# Patient Record
Sex: Male | Born: 1975 | Race: Black or African American | Hispanic: No | Marital: Single | Smoking: Current every day smoker
Health system: Southern US, Community
[De-identification: ages and names within clinical notes are randomized; demographics above are authoritative.]

## PROBLEM LIST (undated history)

## (undated) DIAGNOSIS — I10 Essential (primary) hypertension: Secondary | ICD-10-CM

## (undated) DIAGNOSIS — R011 Cardiac murmur, unspecified: Secondary | ICD-10-CM

---

## 2005-02-06 ENCOUNTER — Emergency Department (HOSPITAL_COMMUNITY): Admission: EM | Admit: 2005-02-06 | Discharge: 2005-02-07 | Payer: Self-pay | Admitting: Emergency Medicine

## 2008-02-10 ENCOUNTER — Emergency Department (HOSPITAL_COMMUNITY): Admission: EM | Admit: 2008-02-10 | Discharge: 2008-02-10 | Payer: Self-pay | Admitting: Emergency Medicine

## 2008-02-18 ENCOUNTER — Emergency Department (HOSPITAL_COMMUNITY): Admission: EM | Admit: 2008-02-18 | Discharge: 2008-02-18 | Payer: Self-pay | Admitting: Emergency Medicine

## 2015-11-12 ENCOUNTER — Encounter (HOSPITAL_COMMUNITY): Payer: Self-pay | Admitting: Emergency Medicine

## 2015-11-12 ENCOUNTER — Emergency Department (HOSPITAL_COMMUNITY)
Admission: EM | Admit: 2015-11-12 | Discharge: 2015-11-12 | Disposition: A | Discharge status: Home / Self Care | Payer: Self-pay | Attending: Emergency Medicine | Admitting: Emergency Medicine

## 2015-11-12 ENCOUNTER — Emergency Department (HOSPITAL_COMMUNITY): Payer: Self-pay

## 2015-11-12 DIAGNOSIS — W010XXA Fall on same level from slipping, tripping and stumbling without subsequent striking against object, initial encounter: Secondary | ICD-10-CM | POA: Insufficient documentation

## 2015-11-12 DIAGNOSIS — S82452A Displaced comminuted fracture of shaft of left fibula, initial encounter for closed fracture: Secondary | ICD-10-CM | POA: Insufficient documentation

## 2015-11-12 DIAGNOSIS — S82842A Displaced bimalleolar fracture of left lower leg, initial encounter for closed fracture: Secondary | ICD-10-CM

## 2015-11-12 DIAGNOSIS — Y9289 Other specified places as the place of occurrence of the external cause: Secondary | ICD-10-CM | POA: Insufficient documentation

## 2015-11-12 DIAGNOSIS — F17200 Nicotine dependence, unspecified, uncomplicated: Secondary | ICD-10-CM | POA: Insufficient documentation

## 2015-11-12 DIAGNOSIS — Y998 Other external cause status: Secondary | ICD-10-CM | POA: Insufficient documentation

## 2015-11-12 DIAGNOSIS — Y9389 Activity, other specified: Secondary | ICD-10-CM | POA: Insufficient documentation

## 2015-11-12 MED ORDER — OXYCODONE-ACETAMINOPHEN 5-325 MG PO TABS
1.0000 | ORAL_TABLET | Freq: Once | ORAL | Status: AC
  Administered 2015-11-12: 1 via ORAL
  Filled 2015-11-12: qty 1

## 2015-11-12 MED ORDER — OXYCODONE-ACETAMINOPHEN 5-325 MG PO TABS: 2.0000 | ORAL_TABLET | ORAL | Status: DC | PRN

## 2015-11-12 NOTE — ED Provider Notes (Signed)
CSN: [id number]     Arrival date & time 11/12/15  2131 History  By signing my name below, I, Lyndel Safe, attest that this documentation has been prepared under the direction and in the presence of [employer], PA-C. Electronically Signed: Lyndel Safe, ED Scribe. 11/12/2015. 9:46 PM.   Chief Complaint  Patient presents with  . Ankle Injury   The history is provided by the patient. No language interpreter was used.   HPI Comments: Scott Horne is a 40 y.o. male who presents to the Emergency Department complaining of sudden onset, constant, moderate pain to left ankle with swelling that is greatest to the medial aspect of left ankle s/p mechanical fall that occurred PTA. Pt reports he slipped on ice and endured an eversion injury to left ankle. He notes he was able to walk approximately 0.5 of a mile after the injury. His pian is worse with ROM of left ankle. Denies head injury, LOC, numbness, tingling, or any other deformities s/p fall.   History reviewed. No pertinent past medical history. History reviewed. No pertinent past surgical history. No family history on file. Social History  Substance Use Topics  . Smoking status: Current Every Day Smoker  . Smokeless tobacco: None  . Alcohol Use: No    Review of Systems  Musculoskeletal: Positive for joint swelling ( left ankle) and arthralgias ( left ankle ). Negative for gait problem.  Skin: Negative for wound.  Neurological: Negative for syncope and numbness.  All other systems reviewed and are negative.  Allergies  Review of patient's allergies indicates no known allergies.  Home Medications   Prior to Admission medications   Not on File   BP 166/107 mmHg  Pulse 104  Temp(Src) 98.3 F (36.8 C) (Oral)  Resp 16  SpO2 100% Physical Exam  Constitutional: He is oriented to person, place, and time. He appears well-developed and well-nourished. No distress.  HENT:  Head: Normocephalic and atraumatic.  Eyes: Conjunctivae  are normal. Right eye exhibits no discharge. Left eye exhibits no discharge. No scleral icterus.  Cardiovascular: Normal rate.   Pulmonary/Chest: Effort normal.  Musculoskeletal:  Significant edema present over lateral malleolus with tenderness to palpation. Pain with eversion and inversion of ankle. Pt able to dorsi and plantar flex with minimal difficulty. Decrease ROM limited by pain. No ecchymosis or erythema. Intact distal pulses.  Neurological: He is alert and oriented to person, place, and time. Coordination normal.  Skin: Skin is warm and dry. No rash noted. He is not diaphoretic. No erythema. No pallor.  Psychiatric: He has a normal mood and affect. His behavior is normal.  Nursing note and vitals reviewed.   ED Course  Procedures  DIAGNOSTIC STUDIES: Oxygen Saturation is 100% on RA, normal by my interpretation.    COORDINATION OF CARE: 9:43 PM Discussed treatment plan which includes to order pain management medication and Xray of left ankle with pt. Pt acknowledges and agrees to plan.   Imaging Review Dg Ankle Complete Left  11/12/2015  CLINICAL DATA:  Pain and swelling of the left ankle status post fall. EXAM: LEFT ANKLE COMPLETE - 3+ VIEW COMPARISON:  None. FINDINGS: There is a spiral comminuted mildly distracted fracture of the distal left fibular shaft with mild lateral displacement of the distal fracture fragment. The more inferior fracture line extends to the ankle mortise. There is an associated soft tissue swelling. Osseous mineralization is normal.  No other fractures identified. IMPRESSION: Intraarticular comminuted mildly distracted fracture of the distal fibula. Electronically Signed  By: Ted Mcalpineobrinka  Dimitrova M.D.   On: 11/12/2015 22:21   I have personally reviewed and evaluated these images results as part of my medical decision-making.   MDM   Final diagnoses:  Pott's fracture (of distal fibula), left, closed, initial encounter    Patient X-Ray reveals  intra-articular comminuted mildly distracted fracture of the distal fibula. Patient is neurovascularly intact. Pain managed in ED.Marland Kitchen.  Pt advised to follow up with orthopedics. Given the nature of this fracture, may require surgical repair in the future. Patient placed in you splint while in ED, conservative therapy recommended and discussed. Patient will be discharged home & is agreeable with above plan. Returns precautions discussed. Pt appears safe for discharge.   I personally performed the services described in this documentation, which was scribed in my presence. The recorded information has been reviewed and is accurate.     Lester KinsmanSamantha Tripp AnvikDowless, PA-C 11/12/15 2239  Benjiman CoreNathan Pickering, MD 11/13/15 [phone]

## 2015-11-12 NOTE — ED Notes (Signed)
Ortho at bedside.

## 2015-11-12 NOTE — ED Notes (Signed)
Patient left at this time with all belongings. 

## 2015-11-12 NOTE — Progress Notes (Signed)
Orthopedic Tech Progress Note Patient Details:  Scott Horne May 26, 1976 [id number]  Ortho Devices Type of Ortho Device: Ace wrap, Stirrup splint, Crutches Ortho Device/Splint Location: LLE Ortho Device/Splint Interventions: Ordered, Application   Jennye MoccasinHughes, [name] 11/12/2015, 10:45 PM

## 2015-11-12 NOTE — Discharge Instructions (Signed)
Fibular Fracture With Rehab °The fibula is the smaller of the two lower leg bones and is vulnerable to breaks (fracture). Fibular fractures may go fully through the bone (complete) or partially (incomplete). The bone fragments are rarely out of alignment (displaced fracture). Fibula fractures may occur anywhere along the bone. However, this document only discusses fractures that do not involve a leg joint. Fibular fractures are not often a severe injury because the bone supports only about 17% of the body weight. °SYMPTOMS  °· Moderate to severe pain in the lower leg. °· Tenderness and swelling in the leg or calf. °· Bleeding and/or bruising (contusion) in the leg. °· Inability to bear weight on the injured extremity. °· Visible deformity, if the fracture is displaced. °· Numbness and coldness in the leg and foot, beyond the fracture site, if blood supply is impaired. °CAUSES  °Fractures occur when a force is placed on the bone that is greater than it can withstand. Common causes of fibular fracture include: °· Direct hit (trauma) (i.e., hockey or lacrosse check to the lower leg). °· Stress fracture (weakening of the bone from repeated stress). °· Indirect injury, caused by twisting, turning quickly, or violent muscle contraction. °RISK INCREASES WITH: °· Contact sports (i.e., football, soccer, lacrosse, hockey). °· Sports that can cause twisted ankle injury (i.e., skiing, basketball). °· Bony abnormalities (i.e., osteoporosis or bone tumors). °· Metabolism disorders, hormone problems, and nutrition deficiency and disorders (i.e., anorexia and bulimia). °· Poor strength and flexibility. °PREVENTION  °· Warm up and stretch properly before activity. °· Maintain physical fitness: °¨ Strength, flexibility, and endurance. °¨ Cardiovascular fitness. °· Wear properly fitted and padded protective equipment (i.e., shin guards for soccer). °PROGNOSIS  °If treated properly, fibular fractures usually heal in 4 to 6 weeks.    °RELATED COMPLICATIONS  °· Failure of bone to heal (nonunion). °· Bone heals in a poor position (malunion). °· Increased pressure inside the leg (compartment syndrome) due to injury that disrupts the blood supply to the leg and foot and injures the nerves and muscles (uncommon). °· Shortening of the injured bones. °· Hindrance of normal bone growth in children. °· Risks of surgery: infection, bleeding, injury to nerves (numbness, weakness, paralysis), need for further surgery. °· Longer healing time if activity is resumed too soon. °TREATMENT °Treatment first involves ice, medicine, and elevation of the leg to reduce pain and inflammation. People with fibular fractures are advised to walk using crutches. A brace or walking boot may be given to restrain the injured leg and allow for healing. Sometimes, surgery is needed to place a rod, plate, or screws in the bones in order to fix the fracture. After surgery, the leg is restrained. After restraint (with or without surgery), it is important to complete strengthening and stretching exercises to regain strength and a full range of motion. Exercises may be completed at home or with a therapist. °MEDICATION  °· If pain medicine is needed, nonsteroidal anti-inflammatory medicines (aspirin and ibuprofen), or other minor pain relievers (acetaminophen), are often advised. °· Do not take pain medicine for 7 days before surgery. °· Prescription pain relievers may be given if your health care provider thinks they are needed. Use only as directed and only as much as you need. °SEEK MEDICAL CARE IF: °· Symptoms get worse or do not improve in 2 weeks, despite treatment. °· The following occur after restraint or surgery. (Report any of these signs immediately): °¨ Swelling above or below the fracture site. °¨ Severe, persistent pain. °¨   Blue or gray skin below the fracture site, especially under the toenails. Numbness or loss of feeling below the fracture site. °¨ New, unexplained  symptoms develop. (Drugs used in treatment may produce side effects.) °EXERCISES  °RANGE OF MOTION (ROM) AND STRETCHING EXERCISES - Fibular Fracture °These exercises may help you when beginning to recover from your injury. Your symptoms may go away with or without further involvement from your physician, physical therapist or athletic trainer. While completing these exercises, remember:  °· Restoring tissue flexibility helps normal motion to return to the joints. This allows healthier, less painful movement and activity. °· An effective stretch should be held for at least 30 seconds. °· A stretch should never be painful. You should only feel a gentle lengthening or release in the stretched tissue. °RANGE OF MOTION - Dorsi/Plantar Flexion °· While sitting with your right / left knee straight, draw the top of your foot upwards by flexing your ankle. Then reverse the motion, pointing your toes downward. °· Hold each position for __________ seconds. °· After completing your first set of exercises, repeat this exercise with your knee bent. °Repeat __________ times. Complete this exercise __________ times per day.  °STRETCH - Gastrocsoleus  °· Sit with your right / left leg extended. Holding onto both ends of a belt or towel, loop it around the ball of your foot. °· Keeping your right / left ankle and foot relaxed and your knee straight, pull your foot and ankle toward you using the belt. °· You should feel a gentle stretch behind your calf or knee. Hold this position for __________ seconds. °Repeat __________ times. Complete this stretch __________ times per day.  °RANGE OF MOTION- Ankle Plantar Flexion  °· Sit with your right / left leg crossed over your opposite knee. °· Use your opposite hand to pull the top of your foot and toes toward you. °· You should feel a gentle stretch on the top of your foot and ankle. Hold this position for __________ seconds. °Repeat __________ times. Complete __________ times per day.    °RANGE OF MOTION - Ankle Eversion °· Sit with your right / left ankle crossed over your opposite knee. °· Grip your foot with your opposite hand, placing your thumb on the top of your foot and your fingers across the bottom of your foot. °· Gently push your foot downward with a slight rotation so your littlest toes rise slightly toward the ceiling. °· You should feel a gentle stretch on the inside of your ankle. Hold the stretch for __________ seconds. °Repeat __________ times. Complete this exercise __________ times per day.  °RANGE OF MOTION - Ankle Inversion °· Sit with your right / left ankle crossed over your opposite knee. °· Grip your foot with your opposite hand, placing your thumb on the bottom of your foot and your fingers across the top of your foot. °· Gently pull your foot so the smallest toe comes toward you and your thumb pushes the inside of the ball of your foot away from you. °· You should feel a gentle stretch on the outside of your ankle. Hold the stretch for __________ seconds. °Repeat __________ times. Complete this exercise __________ times per day.  °RANGE OF MOTION - Ankle Alphabet °· Imagine your right / left big toe is a pen. °· Keeping your hip and knee still, write out the entire alphabet with your "pen." Make the letters as large as you can, without increasing any discomfort. °Repeat __________ times. Complete this exercise   __________ times per day.  °RANGE OF MOTION - Ankle Dorsiflexion, Active Assisted °· Remove your shoes and sit on a chair, preferably not on a carpeted surface. °· Place your right / left foot on the floor, directly under your knee. Extend your opposite leg for support. °· Keeping your heel down, slide your right / left foot back toward the chair, until you feel a stretch at your ankle or calf. If you do not feel a stretch, slide your bottom forward to the edge of the chair, while still keeping your heel down. °· Hold this stretch for __________ seconds. °Repeat  __________ times. Complete this stretch __________ times per day.  °STRENGTHENING EXERCISES - Fibular Fracture °These exercises may help you when beginning to recover from your injury. They may resolve your symptoms with or without further involvement from your physician, physical therapist or athletic trainer. While completing these exercises, remember:  °· Muscles can gain both the endurance and the strength needed for everyday activities through controlled exercises. °· Complete these exercises as instructed by your physician, physical therapist or athletic trainer. Increase the resistance and repetitions only as guided. °· You may experience muscle soreness or fatigue, but the pain or discomfort you are trying to eliminate should never worsen during these exercises. If this pain does get worse, stop and make certain you are following the directions exactly. If the pain is still present after adjustments, discontinue the exercise until you can discuss the trouble with your clinician. °STRENGTH - Dorsiflexors °· Secure a rubber exercise band or tubing to a fixed object (table, pole) and loop the other end around your right / left foot. °· Sit on the floor, facing the fixed object. The band should be slightly tense when your foot is relaxed. °· Slowly draw your foot back toward you, using your ankle and toes. °· Hold this position for __________ seconds. Slowly release the tension in the band and return your foot to the starting position. °Repeat __________ times. Complete this exercise __________ times per day.  °STRENGTH - Plantar-flexors °· Sit with your right / left leg extended. Holding onto both ends of a rubber exercise band or tubing, loop it around the ball of your foot. Keep a slight tension in the band. °· Slowly push your toes away from you, pointing them downward. °· Hold this position for __________ seconds. Return to the starting position slowly, controlling the tension in the band. °Repeat  __________ times. Complete this exercise __________ times per day.  °STRENGTH - Plantar-flexors, Standing  °· Stand with your feet shoulder width apart. Place your hands on a wall or table to steady yourself, using as little support as needed. °· Keeping your weight evenly spread over the width of your feet, rise up on your toes.* °· Hold this position for __________ seconds. °Repeat __________ times. Complete this exercise __________ times per day.  °*If this is too easy, shift your weight toward your right / left leg until you feel challenged. Ultimately, you may be asked to do this exercise while standing on your right / left foot only. °STRENGTH - Towel Curls °· Sit in a chair, on a non-carpeted surface. °· Place your foot on a towel, keeping your heel on the floor. °· Pull the towel toward your heel only by curling your toes. Keep your heel on the floor. °· If instructed by your physician, physical therapist or athletic trainer, add ____________________ at the end of the towel. °Repeat __________ times. Complete this exercise __________   times per day. STRENGTH - Ankle Eversion  Secure one end of a rubber exercise band or tubing to a fixed object (table, pole). Loop the other end around your foot, just before your toes.  Place your fists between your knees. This will focus your strengthening at your ankle.  Drawing the band across your opposite foot, away from the pole, slowly pull your little toe out and up. Make sure the band is positioned to resist the entire motion.  Hold this position for __________ seconds.  Return to the starting position slowly, controlling the tension in the band. Repeat __________ times. Complete this exercise __________ times per day.  STRENGTH - Ankle Inversion  Secure one end of a rubber exercise band or tubing to a fixed object (table, pole). Loop the other end around your foot, just before your toes.  Place your fists between your knees. This will focus your  strengthening at your ankle.  Slowly, pull your big toe up and in, making sure the band is positioned to resist the entire motion.  Hold this position for __________ seconds.  Return to the starting position slowly, controlling the tension in the band. Repeat __________ times. Complete this exercises __________ times per day.    This information is not intended to replace advice given to you by your health care provider. Make sure you discuss any questions you have with your health care provider.   Follow-up with orthopedic provider as soon as possible for consultation and reevaluation. Avoid bearing weight on this foot. Keep ankle and splint. Apply ice to affected area. Take ibuprofen as needed for pain and inflammation. Return to the emergency department a few experience severe increase in your pain, severe swelling, numbness or tingling your extremity, chest pain or shortness of breath.

## 2015-11-12 NOTE — ED Notes (Addendum)
Pt reports slipping while walking to the store this evening. Reports difficulty with ambulation. Swelling/deformity present in L ankle. CMS intact.

## 2015-11-18 ENCOUNTER — Other Ambulatory Visit (HOSPITAL_COMMUNITY): Payer: Self-pay | Admitting: Orthopaedic Surgery

## 2015-11-21 ENCOUNTER — Encounter (HOSPITAL_COMMUNITY): Payer: Self-pay | Admitting: *Deleted

## 2015-11-21 MED ORDER — CEFAZOLIN SODIUM-DEXTROSE 2-3 GM-% IV SOLR
2.0000 g | INTRAVENOUS | Status: AC
  Administered 2015-11-22: 2 g via INTRAVENOUS
  Filled 2015-11-21: qty 50

## 2015-11-21 NOTE — Progress Notes (Signed)
Mr. Scott Horne reports that he had an elevated Blood Pressure, [facility]  Patient was to follow up and he didn't

## 2015-11-22 ENCOUNTER — Observation Stay (HOSPITAL_COMMUNITY)
Admission: RE | Admit: 2015-11-22 | Discharge: 2015-11-23 | Disposition: A | Discharge status: Home / Self Care | Payer: Self-pay | Source: Ambulatory Visit | Attending: Orthopaedic Surgery | Admitting: Orthopaedic Surgery

## 2015-11-22 ENCOUNTER — Ambulatory Visit (HOSPITAL_COMMUNITY): Payer: Self-pay | Admitting: Anesthesiology

## 2015-11-22 ENCOUNTER — Encounter (HOSPITAL_COMMUNITY)
Admission: RE | Disposition: A | Discharge status: Home / Self Care | Payer: Self-pay | Source: Ambulatory Visit | Attending: Orthopaedic Surgery

## 2015-11-22 ENCOUNTER — Encounter (HOSPITAL_COMMUNITY): Payer: Self-pay

## 2015-11-22 ENCOUNTER — Ambulatory Visit (HOSPITAL_COMMUNITY): Payer: Self-pay

## 2015-11-22 DIAGNOSIS — F17290 Nicotine dependence, other tobacco product, uncomplicated: Secondary | ICD-10-CM | POA: Insufficient documentation

## 2015-11-22 DIAGNOSIS — Z419 Encounter for procedure for purposes other than remedying health state, unspecified: Secondary | ICD-10-CM

## 2015-11-22 DIAGNOSIS — S8262XA Displaced fracture of lateral malleolus of left fibula, initial encounter for closed fracture: Principal | ICD-10-CM | POA: Diagnosis present

## 2015-11-22 DIAGNOSIS — W000XXA Fall on same level due to ice and snow, initial encounter: Secondary | ICD-10-CM | POA: Insufficient documentation

## 2015-11-22 DIAGNOSIS — I10 Essential (primary) hypertension: Secondary | ICD-10-CM | POA: Insufficient documentation

## 2015-11-22 HISTORY — PX: ORIF ANKLE FRACTURE: SHX5408

## 2015-11-22 HISTORY — DX: Essential (primary) hypertension: I10

## 2015-11-22 HISTORY — DX: Cardiac murmur, unspecified: R01.1

## 2015-11-22 LAB — SURGICAL PCR SCREEN
MRSA, PCR: NEGATIVE
Staphylococcus aureus: NEGATIVE

## 2015-11-22 LAB — BASIC METABOLIC PANEL
Anion gap: 8 (ref 5–15)
BUN: 13 mg/dL (ref 6–20)
CALCIUM: 8.9 mg/dL (ref 8.9–10.3)
CO2: 26 mmol/L (ref 22–32)
CREATININE: 1.28 mg/dL — AB (ref 0.61–1.24)
Chloride: 105 mmol/L (ref 101–111)
GFR calc Af Amer: >60 mL/min (ref >60)
GLUCOSE: 93 mg/dL (ref 65–99)
Potassium: 4.6 mmol/L (ref 3.5–5.1)
Sodium: 139 mmol/L (ref 135–145)

## 2015-11-22 LAB — CBC
HEMATOCRIT: 40.4 % (ref 39.0–52.0)
Hemoglobin: 13.8 g/dL (ref 13.0–17.0)
MCH: 34.6 pg — AB (ref 26.0–34.0)
MCHC: 34.2 g/dL (ref 30.0–36.0)
MCV: 101.3 fL — AB (ref 78.0–100.0)
Platelets: 324 10*3/uL (ref 150–400)
RBC: 3.99 MIL/uL — ABNORMAL LOW (ref 4.22–5.81)
RDW: 13.3 % (ref 11.5–15.5)
WBC: 6.7 10*3/uL (ref 4.0–10.5)

## 2015-11-22 SURGERY — OPEN REDUCTION INTERNAL FIXATION (ORIF) ANKLE FRACTURE
Anesthesia: General | Site: Ankle | Laterality: Left

## 2015-11-22 MED ORDER — ONDANSETRON HCL 4 MG/2ML IJ SOLN: 4.0000 mg | Freq: Once | INTRAMUSCULAR | Status: DC | PRN

## 2015-11-22 MED ORDER — ONDANSETRON HCL 4 MG/2ML IJ SOLN: 4.0000 mg | Freq: Four times a day (QID) | INTRAMUSCULAR | Status: DC | PRN

## 2015-11-22 MED ORDER — CEFAZOLIN SODIUM 1-5 GM-% IV SOLN
1.0000 g | Freq: Four times a day (QID) | INTRAVENOUS | Status: AC
  Administered 2015-11-22 – 2015-11-23 (×3): 1 g via INTRAVENOUS
  Filled 2015-11-22 (×3): qty 50

## 2015-11-22 MED ORDER — SODIUM CHLORIDE 0.9 % IV SOLN
INTRAVENOUS | Status: DC
  Administered 2015-11-22: 18:00:00 via INTRAVENOUS

## 2015-11-22 MED ORDER — DIPHENHYDRAMINE HCL 12.5 MG/5ML PO ELIX: 12.5000 mg | ORAL_SOLUTION | ORAL | Status: DC | PRN

## 2015-11-22 MED ORDER — FENTANYL CITRATE (PF) 250 MCG/5ML IJ SOLN
INTRAMUSCULAR | Status: AC
  Filled 2015-11-22: qty 5

## 2015-11-22 MED ORDER — PHENYLEPHRINE HCL 10 MG/ML IJ SOLN
INTRAMUSCULAR | Status: DC | PRN
  Administered 2015-11-22: 80 ug via INTRAVENOUS
  Administered 2015-11-22: 40 ug via INTRAVENOUS
  Administered 2015-11-22 (×4): 80 ug via INTRAVENOUS
  Administered 2015-11-22: 40 ug via INTRAVENOUS

## 2015-11-22 MED ORDER — PHENYLEPHRINE 40 MCG/ML (10ML) SYRINGE FOR IV PUSH (FOR BLOOD PRESSURE SUPPORT)
PREFILLED_SYRINGE | INTRAVENOUS | Status: AC
  Filled 2015-11-22: qty 10

## 2015-11-22 MED ORDER — OXYCODONE HCL 5 MG PO TABS
5.0000 mg | ORAL_TABLET | ORAL | Status: DC | PRN
  Administered 2015-11-23 (×2): 10 mg via ORAL
  Filled 2015-11-22 (×2): qty 2

## 2015-11-22 MED ORDER — 0.9 % SODIUM CHLORIDE (POUR BTL) OPTIME
TOPICAL | Status: DC | PRN
  Administered 2015-11-22: 1000 mL

## 2015-11-22 MED ORDER — METHOCARBAMOL 500 MG PO TABS
500.0000 mg | ORAL_TABLET | Freq: Four times a day (QID) | ORAL | Status: DC | PRN
  Administered 2015-11-22 – 2015-11-23 (×2): 500 mg via ORAL
  Filled 2015-11-22 (×2): qty 1

## 2015-11-22 MED ORDER — MUPIROCIN 2 % EX OINT
TOPICAL_OINTMENT | CUTANEOUS | Status: AC
  Administered 2015-11-22: 1
  Filled 2015-11-22: qty 22

## 2015-11-22 MED ORDER — ONDANSETRON HCL 4 MG/2ML IJ SOLN
INTRAMUSCULAR | Status: DC | PRN
  Administered 2015-11-22: 4 mg via INTRAVENOUS

## 2015-11-22 MED ORDER — ACETAMINOPHEN 650 MG RE SUPP: 650.0000 mg | Freq: Four times a day (QID) | RECTAL | Status: DC | PRN

## 2015-11-22 MED ORDER — LIDOCAINE HCL (CARDIAC) 20 MG/ML IV SOLN
INTRAVENOUS | Status: DC | PRN
  Administered 2015-11-22: 80 mg via INTRAVENOUS

## 2015-11-22 MED ORDER — PROPOFOL 10 MG/ML IV BOLUS
INTRAVENOUS | Status: AC
  Filled 2015-11-22: qty 20

## 2015-11-22 MED ORDER — ACETAMINOPHEN 325 MG PO TABS: 650.0000 mg | ORAL_TABLET | Freq: Four times a day (QID) | ORAL | Status: DC | PRN

## 2015-11-22 MED ORDER — ONDANSETRON HCL 4 MG PO TABS: 4.0000 mg | ORAL_TABLET | Freq: Four times a day (QID) | ORAL | Status: DC | PRN

## 2015-11-22 MED ORDER — HYDROMORPHONE HCL 1 MG/ML IJ SOLN: 1.0000 mg | INTRAMUSCULAR | Status: DC | PRN

## 2015-11-22 MED ORDER — METHOCARBAMOL 1000 MG/10ML IJ SOLN
500.0000 mg | Freq: Four times a day (QID) | INTRAVENOUS | Status: DC | PRN
  Filled 2015-11-22: qty 5

## 2015-11-22 MED ORDER — MIDAZOLAM HCL 2 MG/2ML IJ SOLN
INTRAMUSCULAR | Status: AC
  Administered 2015-11-22: 2 mg
  Filled 2015-11-22: qty 2

## 2015-11-22 MED ORDER — MIDAZOLAM HCL 2 MG/2ML IJ SOLN
INTRAMUSCULAR | Status: AC
  Filled 2015-11-22: qty 2

## 2015-11-22 MED ORDER — FENTANYL CITRATE (PF) 100 MCG/2ML IJ SOLN
INTRAMUSCULAR | Status: DC | PRN
  Administered 2015-11-22 (×2): 50 ug via INTRAVENOUS

## 2015-11-22 MED ORDER — LACTATED RINGERS IV SOLN
INTRAVENOUS | Status: DC | PRN
  Administered 2015-11-22 (×2): via INTRAVENOUS

## 2015-11-22 MED ORDER — METOCLOPRAMIDE HCL 5 MG/ML IJ SOLN: 5.0000 mg | Freq: Three times a day (TID) | INTRAMUSCULAR | Status: DC | PRN

## 2015-11-22 MED ORDER — HYDROMORPHONE HCL 1 MG/ML IJ SOLN
0.2500 mg | INTRAMUSCULAR | Status: DC | PRN
Start: 2015-11-22 — End: 2015-11-22

## 2015-11-22 MED ORDER — METOCLOPRAMIDE HCL 5 MG PO TABS: 5.0000 mg | ORAL_TABLET | Freq: Three times a day (TID) | ORAL | Status: DC | PRN

## 2015-11-22 MED ORDER — MEPERIDINE HCL 25 MG/ML IJ SOLN: 6.2500 mg | INTRAMUSCULAR | Status: DC | PRN

## 2015-11-22 MED ORDER — FENTANYL CITRATE (PF) 100 MCG/2ML IJ SOLN
INTRAMUSCULAR | Status: AC
  Administered 2015-11-22: 75 ug
  Filled 2015-11-22: qty 2

## 2015-11-22 MED ORDER — PROPOFOL 10 MG/ML IV BOLUS
INTRAVENOUS | Status: DC | PRN
  Administered 2015-11-22: 30 mg via INTRAVENOUS
  Administered 2015-11-22: 200 mg via INTRAVENOUS
  Administered 2015-11-22: 50 mg via INTRAVENOUS
  Administered 2015-11-22: 20 mg via INTRAVENOUS

## 2015-11-22 SURGICAL SUPPLY — 72 items
BANDAGE ELASTIC 4 VELCRO ST LF (GAUZE/BANDAGES/DRESSINGS) ×3 IMPLANT
BANDAGE ELASTIC 6 VELCRO ST LF (GAUZE/BANDAGES/DRESSINGS) ×3 IMPLANT
BANDAGE ESMARK 6X9 LF (GAUZE/BANDAGES/DRESSINGS) ×1 IMPLANT
BIT DRILL 3.5X122MM AO FIT (BIT) ×3 IMPLANT
BNDG COHESIVE 4X5 TAN STRL (GAUZE/BANDAGES/DRESSINGS) ×3 IMPLANT
BNDG ESMARK 6X9 LF (GAUZE/BANDAGES/DRESSINGS) ×3
CLEANER TIP ELECTROSURG 2X2 (MISCELLANEOUS) ×3 IMPLANT
COVER SURGICAL LIGHT HANDLE (MISCELLANEOUS) ×3 IMPLANT
CUFF TOURNIQUET SINGLE 24IN (TOURNIQUET CUFF) ×3 IMPLANT
DRAPE C-ARM 42X72 X-RAY (DRAPES) ×3 IMPLANT
DRAPE C-ARMOR (DRAPES) ×3 IMPLANT
DRAPE U-SHAPE 47X51 STRL (DRAPES) ×3 IMPLANT
DRILL 2.6X122MM WL AO SHAFT (BIT) ×3 IMPLANT
DRSG PAD ABDOMINAL 8X10 ST (GAUZE/BANDAGES/DRESSINGS) ×3 IMPLANT
DURAPREP 26ML APPLICATOR (WOUND CARE) ×3 IMPLANT
ELECT CAUTERY BLADE 6.4 (BLADE) ×3 IMPLANT
ELECT REM PT RETURN 9FT ADLT (ELECTROSURGICAL) ×3
ELECTRODE REM PT RTRN 9FT ADLT (ELECTROSURGICAL) ×1 IMPLANT
GAUZE SPONGE 4X4 12PLY STRL (GAUZE/BANDAGES/DRESSINGS) ×3 IMPLANT
GAUZE XEROFORM 5X9 LF (GAUZE/BANDAGES/DRESSINGS) ×3 IMPLANT
GLOVE BIO SURGEON STRL SZ 6.5 (GLOVE) ×4 IMPLANT
GLOVE BIO SURGEON STRL SZ8 (GLOVE) ×3 IMPLANT
GLOVE BIO SURGEONS STRL SZ 6.5 (GLOVE) ×2
GLOVE BIOGEL PI IND STRL 6.5 (GLOVE) ×3 IMPLANT
GLOVE BIOGEL PI IND STRL 7.0 (GLOVE) ×1 IMPLANT
GLOVE BIOGEL PI IND STRL 8 (GLOVE) ×2 IMPLANT
GLOVE BIOGEL PI INDICATOR 6.5 (GLOVE) ×6
GLOVE BIOGEL PI INDICATOR 7.0 (GLOVE) ×2
GLOVE BIOGEL PI INDICATOR 8 (GLOVE) ×4
GLOVE ORTHO TXT STRL SZ7.5 (GLOVE) ×3 IMPLANT
GLOVE SURG SS PI 6.5 STRL IVOR (GLOVE) ×3 IMPLANT
GOWN STRL REUS W/ TWL LRG LVL3 (GOWN DISPOSABLE) ×2 IMPLANT
GOWN STRL REUS W/ TWL XL LVL3 (GOWN DISPOSABLE) ×4 IMPLANT
GOWN STRL REUS W/TWL LRG LVL3 (GOWN DISPOSABLE) ×4
GOWN STRL REUS W/TWL XL LVL3 (GOWN DISPOSABLE) ×8
KIT BASIN OR (CUSTOM PROCEDURE TRAY) ×3 IMPLANT
KIT ROOM TURNOVER OR (KITS) ×3 IMPLANT
MANIFOLD NEPTUNE II (INSTRUMENTS) ×3 IMPLANT
NS IRRIG 1000ML POUR BTL (IV SOLUTION) ×3 IMPLANT
PACK ORTHO EXTREMITY (CUSTOM PROCEDURE TRAY) ×3 IMPLANT
PAD ARMBOARD 7.5X6 YLW CONV (MISCELLANEOUS) ×6 IMPLANT
PAD CAST 4YDX4 CTTN HI CHSV (CAST SUPPLIES) ×2 IMPLANT
PADDING CAST ABS 6INX4YD NS (CAST SUPPLIES) ×2
PADDING CAST ABS COTTON 6X4 NS (CAST SUPPLIES) ×1 IMPLANT
PADDING CAST COTTON 4X4 STRL (CAST SUPPLIES) ×4
PADDING CAST COTTON 6X4 STRL (CAST SUPPLIES) ×3 IMPLANT
PLATE FIBULA 4H (Plate) ×3 IMPLANT
PREFILTER NEPTUNE (MISCELLANEOUS) ×3 IMPLANT
SCREW BONE 14MMX3.5MM (Screw) ×3 IMPLANT
SCREW BONE 3.5X20MM (Screw) ×3 IMPLANT
SCREW BONE NON-LCKING 3.5X12MM (Screw) ×6 IMPLANT
SCREW LOCK 3.5X14 (Screw) ×3 IMPLANT
SCREW LOCKING 3.5X12 (Screw) ×3 IMPLANT
SCREW LOCKING 3.5X16MM (Screw) ×3 IMPLANT
SPONGE GAUZE 4X4 12PLY STER LF (GAUZE/BANDAGES/DRESSINGS) ×3 IMPLANT
SPONGE LAP 4X18 X RAY DECT (DISPOSABLE) ×6 IMPLANT
SUCTION FRAZIER TIP 10 FR DISP (SUCTIONS) ×3 IMPLANT
SUT ETHILON 2 0 FS 18 (SUTURE) ×6 IMPLANT
SUT VIC AB 0 CT1 27 (SUTURE) ×2
SUT VIC AB 0 CT1 27XBRD ANBCTR (SUTURE) ×1 IMPLANT
SUT VIC AB 1 CT1 27 (SUTURE) ×2
SUT VIC AB 1 CT1 27XBRD ANBCTR (SUTURE) ×1 IMPLANT
SUT VIC AB 2-0 CT1 27 (SUTURE) ×4
SUT VIC AB 2-0 CT1 27XBRD (SUTURE) ×1 IMPLANT
SUT VIC AB 2-0 CT1 36 (SUTURE) ×3 IMPLANT
SUT VIC AB 2-0 CT1 TAPERPNT 27 (SUTURE) ×1 IMPLANT
SUT VICRYL 0 CT 1 36IN (SUTURE) ×3 IMPLANT
TOWEL OR 17X24 6PK STRL BLUE (TOWEL DISPOSABLE) ×3 IMPLANT
TOWEL OR 17X26 10 PK STRL BLUE (TOWEL DISPOSABLE) ×3 IMPLANT
TUBE CONNECTING 12'X1/4 (SUCTIONS) ×1
TUBE CONNECTING 12X1/4 (SUCTIONS) ×2 IMPLANT
WATER STERILE IRR 1000ML POUR (IV SOLUTION) ×3 IMPLANT

## 2015-11-22 NOTE — Brief Op Note (Signed)
11/22/2015  1:55 PM  PATIENT:  Scott Horne  40 y.o. male  PRE-OPERATIVE DIAGNOSIS:  Left ankle unstable lateral malleolus fracture  POST-OPERATIVE DIAGNOSIS:  Left ankle unstable lateral malleolus fracture  PROCEDURE:  Procedure(s): OPEN REDUCTION INTERNAL FIXATION (ORIF) LEFT ANKLE LATERAL MALLEOLUS FRACTURE (Left)  SURGEON:  Surgeon(s) and Role:    * Kathryne Hitch, MD - Primary  PHYSICIAN ASSISTANT: Rexene Edison, PA-C  ANESTHESIA:   regional and general  EBL:    minimal  COUNTS:  YES  TOURNIQUET:  * Missing tourniquet times found for documented tourniquets in log:  [id number] *  DICTATION: .Other Dictation: Dictation Number [id number]  PLAN OF CARE: Admit to inpatient   PATIENT DISPOSITION:  PACU - hemodynamically stable.   Delay start of Pharmacological VTE agent (>24hrs) due to surgical blood loss or risk of bleeding: no

## 2015-11-22 NOTE — H&P (Signed)
Scott Horne is an 40 y.o. male.   Chief Complaint:   Left ankle pain with known unstable fracture HPI: 40 yo male who sustained a left ankle fracture after an accidental slip and fall on the ice.  Was seen at the ED and found to have an unstable left ankle lateral malleolus fracture.  Surgery has been recommended and discussed in detail.  Past Medical History  Diagnosis Date  . Heart murmur     as a child  . Hypertension     Past Surgical History  Procedure Laterality Date  . No past surgeries      History reviewed. No pertinent family history. Social History:  reports that he has been smoking Cigars.  He does not have any smokeless tobacco history on file. He reports that he drinks about 16.8 oz of alcohol per week. He reports that he uses illicit drugs (Marijuana).  Allergies: No Known Allergies  Medications Prior to Admission  Medication Sig Dispense Refill  . oxyCODONE-acetaminophen (PERCOCET/ROXICET) 5-325 MG tablet Take 2 tablets by mouth every 4 (four) hours as needed for severe pain. 15 tablet 0    No results found for this or any previous visit (from the past 48 hour(s)). No results found.  Review of Systems  All other systems reviewed and are negative.   Blood pressure 121/94, pulse 65, temperature 98.3 F (36.8 C), temperature source Oral, resp. rate 18, height  (1.803 m), weight 92.08 kg (203 lb), SpO2 100 %. Physical Exam  Constitutional: He is oriented to person, place, and time. He appears well-developed and well-nourished.  HENT:  Head: Normocephalic and atraumatic.  Eyes: EOM are normal. Pupils are equal, round, and reactive to light.  Neck: Normal range of motion. Neck supple.  Cardiovascular: Normal rate and regular rhythm.   Respiratory: Effort normal and breath sounds normal.  GI: Soft. Bowel sounds are normal.  Musculoskeletal:       Left ankle: He exhibits decreased range of motion, swelling and ecchymosis. Tenderness. Lateral malleolus and  medial malleolus tenderness found.  Neurological: He is alert and oriented to person, place, and time.  Skin: Skin is warm.  Psychiatric: He has a normal mood and affect.     Assessment/Plan Unstable left ankle lateral malleolus fracture with widening of the ankle mortise 1)  To the OR today for open reduction/internal fixation of his left ankle fracture followed by admission overnight for pain meds, antibiotics, and PT.  Risks and benefits discussed and informed consent obtained.  Kathryne Hitch 11/22/2015, 12:13 PM

## 2015-11-22 NOTE — Anesthesia Procedure Notes (Addendum)
Procedure Name: LMA Insertion Date/Time: 11/22/2015 1:15 PM Performed by: Daiva Eves Pre-anesthesia Checklist: Patient identified, Timeout performed, Emergency Drugs available, Suction available and Patient being monitored Patient Re-evaluated:Patient Re-evaluated prior to inductionOxygen Delivery Method: Circle system utilized Preoxygenation: Pre-oxygenation with 100% oxygen Intubation Type: IV induction LMA: LMA inserted LMA Size: 5.0 Number of attempts: 1 Placement Confirmation: positive ETCO2,  CO2 detector and breath sounds checked- equal and bilateral Tube secured with: Tape Dental Injury: Teeth and Oropharynx as per pre-operative assessment    Anesthesia Regional Block:  Popliteal block  Pre-Anesthetic Checklist: ,, timeout performed, Correct Patient, Correct Site, Correct Laterality, Correct Procedure, Correct Position, site marked, Risks and benefits discussed,  Surgical consent,  Pre-op evaluation,  At surgeon's request and post-op pain management   Prep: chloraprep       Needles:  Injection technique: Single-shot  Needle Type: Echogenic Stimulator Needle     Needle Length: 9cm 9 cm Needle Gauge: 21 and 21 G    Additional Needles:  Procedures: ultrasound guided (picture in chart) Popliteal block Narrative:  Injection made incrementally with aspirations every 5 mL.  Performed by: Personally  Anesthesiologist: Bronislaus Verdell  Additional Notes: Risks, benefits and alternative to block explained extensively.  Patient tolerated procedure well, without complications.

## 2015-11-22 NOTE — Anesthesia Preprocedure Evaluation (Addendum)
Anesthesia Evaluation  Patient identified by MRN, date of birth, ID band Patient awake    Reviewed: Allergy & Precautions, H&P , NPO status , Patient's Chart, lab work & pertinent test results  History of Anesthesia Complications Negative for: history of anesthetic complications  Airway Mallampati: II  TM Distance: >3 FB Neck ROM: full    Dental no notable dental hx.    Pulmonary Current Smoker,    Pulmonary exam normal breath sounds clear to auscultation       Cardiovascular hypertension, Pt. on medications Normal cardiovascular exam Rhythm:regular Rate:Normal     Neuro/Psych negative neurological ROS     GI/Hepatic negative GI ROS, Neg liver ROS,   Endo/Other  negative endocrine ROS  Renal/GU negative Renal ROS     Musculoskeletal   Abdominal   Peds  Hematology negative hematology ROS (+)   Anesthesia Other Findings   Reproductive/Obstetrics negative OB ROS                            Anesthesia Physical Anesthesia Plan  ASA: II  Anesthesia Plan: General   Post-op Pain Management: GA combined w/ Regional for post-op pain   Induction: Intravenous  Airway Management Planned: LMA  Additional Equipment:   Intra-op Plan:   Post-operative Plan: Extubation in OR  Informed Consent: I have reviewed the patients History and Physical, chart, labs and discussed the procedure including the risks, benefits and alternatives for the proposed anesthesia with the patient or authorized representative who has indicated his/her understanding and acceptance.   Dental Advisory Given  Plan Discussed with: CRNA and Surgeon  Anesthesia Plan Comments: (Plan for GA with LMA and preop popliteal/saphenous block)       Anesthesia Quick Evaluation

## 2015-11-22 NOTE — Progress Notes (Signed)
Orthopedic Tech Progress Note Patient Details:  Scott Horne 11-12-1975 [id number]  Ortho Devices Type of Ortho Device: CAM walker Ortho Device/Splint Location: lle Ortho Device/Splint Interventions: Application   Dava Rensch 11/22/2015, 3:09 PM

## 2015-11-22 NOTE — Transfer of Care (Signed)
Immediate Anesthesia Transfer of Care Note  Patient: Scott Horne  Procedure(s) Performed: Procedure(s): OPEN REDUCTION INTERNAL FIXATION (ORIF) LEFT ANKLE LATERAL MALLEOLUS FRACTURE (Left)  Patient Location: PACU  Anesthesia Type:General  Level of Consciousness: awake, alert  and oriented  Airway & Oxygen Therapy: Patient Spontanous Breathing  Post-op Assessment: Report given to RN and Post -op Vital signs reviewed and stable  Post vital signs: Reviewed and stable  Last Vitals:  Filed Vitals:   11/22/15 1103  BP: 121/94  Pulse: 65  Temp: 36.8 C  Resp: 18    Complications: No apparent anesthesia complications

## 2015-11-22 NOTE — Anesthesia Postprocedure Evaluation (Signed)
Anesthesia Post Note  Patient: Scott Horne  Procedure(s) Performed: Procedure(s) (LRB): OPEN REDUCTION INTERNAL FIXATION (ORIF) LEFT ANKLE LATERAL MALLEOLUS FRACTURE (Left)  Patient location during evaluation: PACU Anesthesia Type: General Level of consciousness: awake and alert Pain management: pain level controlled Vital Signs Assessment: post-procedure vital signs reviewed and stable Respiratory status: spontaneous breathing, nonlabored ventilation, respiratory function stable and patient connected to nasal cannula oxygen Cardiovascular status: blood pressure returned to baseline and stable Postop Assessment: no signs of nausea or vomiting Anesthetic complications: no    Last Vitals:  Filed Vitals:   11/22/15 1545 11/22/15 1615  BP: 124/97 130/98  Pulse:  50  Temp:    Resp: 19 19    Last Pain:  Filed Vitals:   11/22/15 1618  PainSc: 0-No pain                 Ardra Kuznicki DAVID

## 2015-11-22 NOTE — Op Note (Signed)
NAMECOE, ANGELOS NO.:  [id number]  MEDICAL RECORD NO.:  [id number]  LOCATION:  6N11C                        FACILITY:  [facility]  PHYSICIAN:  Vanita Panda. Magnus Ivan, M.D.DATE OF BIRTH:  05-16-1976  DATE OF PROCEDURE:  11/22/2015 DATE OF DISCHARGE:                              OPERATIVE REPORT   PREOPERATIVE DIAGNOSIS:  Left unstable lateral malleolus/fibular fracture with widening of the ankle mortise.  POSTOPERATIVE DIAGNOSIS:  Left unstable lateral malleolus/fibular fracture with widening of the ankle mortise.  PROCEDURE:  Open reduction and internal fixation of left ankle lateral malleolus fracture.  IMPLANTS:  Stryker 4-hole distal fibular plate with bicortical screws proximally and locking screws distally.  SURGEON:  Vanita Panda. Magnus Ivan, MD  ASSISTANT:  Richardean Canal, PA-C  ANESTHESIA: 1. Regional left lower extremity block. 2. General.  TOURNIQUET TIME:  Less than 1 hour.  BLOOD LOSS:  Minimal.  COMPLICATIONS:  None.  INDICATIONS:  Trase is a 40 year old gentleman, who slipped in the snow 2 weekends ago sustaining an unstable left ankle fracture.  He had displaced fibular fracture and widening ankle mortise.  He was placed appropriately in a splint by the ER staff and given followup in my office.  I showed him the x-rays and he recognized the unstable nature of the fracture.  I explained in detail the risks and benefits of surgery, as well as the actual need for surgery.  He understands this fully.  I talked to his dad about this as well and they do understand the need to proceed with surgery.  We want to get the swelling down so we will proceed to the OR today, finally just over a week after his injury because the swelling has subsided.  PROCEDURE DESCRIPTION:  After informed consent was obtained, appropriate left ankle was marked.  Anesthesia obtained by a regional block.  He was then brought to the operating room, placed supine on  the operating table.  General anesthesia was then obtained.  A bump was placed under his left hip to internally rotate his left ankle and a nonsterile tourniquet was placed around his upper left thigh.  His left ankle and leg and foot were prepped and draped with DuraPrep and sterile drapes. A time-out was called and he was identified as correct patient, correct left ankle.  We then made an incision over the lateral malleolus and carried this proximally and distally.  We were able to dissect down the fracture and then cleaned the fracture of debris and hematoma using irrigation.  We then reduced the fracture with reduction forceps.  We tried to perform a lag technique, but I was able to do so with the screws.  We took that screw out and kept the forceps reducing the fracture under direct fluoroscopy, and then placed a 4-hole Stryker Composite distal fibular plate and secured this with bicortical screws proximally and locking screws distally.  We let our clamp off and the fracture remained stable.  We then stressed the ankle mortise under fluoroscopy and it was stable.  We then irrigated the soft tissues with normal saline solution, closed the deep tissue and plate with 0 Vicryl suture followed by  2-0 Vicryl subcutaneous tissue, interrupted 2-0 nylon on the skin.  Xeroform and well-padded sterile dressing was applied.  He was then awakened, extubated, and taken to the recovery room in stable condition.  All final counts were correct.  There were no complications noted.  Of note, Richardean Canal, PA-C assisted in entire case.  His assistance was crucial for helping facilitating this case at all aspects.     Vanita Panda. Magnus Ivan, M.D.     CYB/MEDQ  D:  11/22/2015  T:  11/22/2015  Job:  [id number]

## 2015-11-23 ENCOUNTER — Encounter (HOSPITAL_COMMUNITY): Payer: Self-pay | Admitting: General Practice

## 2015-11-23 MED ORDER — OXYCODONE-ACETAMINOPHEN 5-325 MG PO TABS: 1.0000 | ORAL_TABLET | ORAL | Status: AC | PRN

## 2015-11-23 NOTE — Evaluation (Addendum)
Physical Therapy Evaluation Patient Details Name: Scott Horne MRN: [id number] DOB: [birth year] Today's Date: 11/23/2015   History of Present Illness  Pt is a 40 y.o. male admitted on 11/22/15 for ORIF of left lateral malleolus fx due to a fall on ice. Pt has other significant PMH of HTN.  Clinical Impression  Pt was agreeable and ready to d/c home today. Pt is NWB on LLE and in CAM walker while moving around. Pt ambulated on crutches with min guard for safety 100 feet and successfully completed 10 stairs. Pt required verbal reminders to slow down while walking. Pt required verbal cues for descending the stairs, but was able to ascend the stairs without any verbal cueing. Pt reported feeling tired at end of session.  PT to follow acutely for deficits listed below.       Follow Up Recommendations No PT follow up    Equipment Recommendations  None recommended by PT    Recommendations for Other Services       Precautions / Restrictions Precautions Precautions: Fall Restrictions Weight Bearing Restrictions: Yes LLE Weight Bearing: Non weight bearing      Mobility  Bed Mobility Overal bed mobility: Independent             General bed mobility comments: Pt was supine when session began, and he independently transferred to EOB when he heard we were going to walk.  Transfers Overall transfer level: Independent Equipment used: Crutches                Ambulation/Gait Ambulation/Gait assistance: Physicist, medical (Feet): 100 Feet Assistive device: Crutches Gait Pattern/deviations: Step-to pattern     General Gait Details: Pt correctly utilizes his crutches, but he required verbal cues to slow down for safety. Pt reported the CAM walker was heavy, and causing him to feel fatigued once finished.  Stairs Stairs: Yes Stairs assistance: Min guard Stair Management: No rails;With crutches Number of Stairs: 10 General stair comments: Without prompting, Pt correctly  demonstrated how to ascend the stairs. Pt required verbal reminder for the descent, but then demonstrated it on his own. Pt required min guard for safety.         Balance Overall balance assessment: Modified Independent (requires crutches in standing; independent in sitting)                                           Pertinent Vitals/Pain Pain Assessment: 0-10 Pain Score: 3  Pain Location: left foot and ankle Pain Descriptors / Indicators: Constant;Aching Pain Intervention(s): Monitored during session;Repositioned    Home Living Family/patient expects to be discharged to:: Private residence Living Arrangements: Parent Available Help at Discharge: Family Type of Home: House Home Access: Stairs to enter Entrance Stairs-Rails: None Technical brewer of Steps: 5 Home Layout: One level Home Equipment: Crutches      Prior Function Level of Independence: Independent               Hand Dominance   Dominant Hand: Right       Communication   Communication: No difficulties  Cognition Arousal/Alertness: Awake/alert Behavior During Therapy: WFL for tasks assessed/performed Overall Cognitive Status: Within Functional Limits for tasks assessed                               Assessment/Plan    PT  Assessment All further PT needs can be met in the next venue of care (OP PT when MD says is appropriate )  PT Diagnosis Difficulty walking;Abnormality of gait;Acute pain   PT Problem List Decreased strength;Decreased activity tolerance;Decreased balance;Decreased mobility;Decreased knowledge of use of DME;Pain  PT Treatment Interventions     PT Goals (Current goals can be found in the Care Plan section) Acute Rehab PT Goals Patient Stated Goal: go home  PT Goal Formulation: All assessment and education complete, DC therapy    Frequency      End of Session Equipment Utilized During Treatment: Gait belt Activity Tolerance: Patient tolerated  treatment well Patient left: in bed;with call bell/phone within reach;with family/visitor present Nurse Communication: Other (comment) (Pt cleared for d/c by PT )         Time: 2446-9507 PT Time Calculation (min) (ACUTE ONLY): 17 min   Charges:  1 low evaluation  PT Evaluation $PT Eval Low Complexity: 1 Procedure          [place], [place] office Arelia Sneddon 11/23/2015, 1:50 PM

## 2015-11-23 NOTE — Progress Notes (Signed)
Subjective: 1 Day Post-Op Procedure(s) (LRB): OPEN REDUCTION INTERNAL FIXATION (ORIF) LEFT ANKLE LATERAL MALLEOLUS FRACTURE (Left) Patient reports pain as moderate.    Objective: Vital signs in last 24 hours: Temp:  [97 F (36.1 C)-98.9 F (37.2 C)] 98.9 F (37.2 C) (01/18 0514) Pulse Rate:  [50-79] 79 (01/18 0514) Resp:  [9-19] 16 (01/18 0514) BP: (116-145)/(87-98) 145/97 mmHg (01/18 0514) SpO2:  [100 %] 100 % (01/18 0514) Weight:  [92.08 kg (203 lb)] 92.08 kg (203 lb) (01/17 1103)  Intake/Output from previous day: 01/17 0701 - 01/18 0700 In: 2533.8 [P.O.:360; I.V.:2023.8; IV Piggyback:150] Out: 1025 [Urine:1025] Intake/Output this shift:     Recent Labs  11/22/15 1535  HGB 13.8    Recent Labs  11/22/15 1535  WBC 6.7  RBC 3.99*  HCT 40.4  PLT 324    Recent Labs  11/22/15 1535  NA 139  K 4.6  CL 105  CO2 26  BUN 13  CREATININE 1.28*  GLUCOSE 93  CALCIUM 8.9   No results for input(s): LABPT, INR in the last 72 hours.  Sensation intact distally Intact pulses distally Dorsiflexion/Plantar flexion intact Incision: dressing C/D/I Compartment soft  Assessment/Plan: 1 Day Post-Op Procedure(s) (LRB): OPEN REDUCTION INTERNAL FIXATION (ORIF) LEFT ANKLE LATERAL MALLEOLUS FRACTURE (Left) Up with therapy  Discharge to home today.  BLACKMAN,CHRISTOPHER Y 11/23/2015, 7:23 AM

## 2015-11-23 NOTE — Discharge Instructions (Signed)
No weight on your left ankle at all. Put the boot on when you are up. Ice and elevation as needed for swelling. Keep your dressing clean and dry.

## 2015-11-23 NOTE — Progress Notes (Signed)
Discussed discharge summary with patient. Reviewed all medications with patient. Patient received Rx. Patient ready for discharge. 

## 2015-11-23 NOTE — Discharge Summary (Signed)
Patient ID: Scott Horne MRN: [id number] DOB/AGE: 39-Jul-1977 39 y.o.  Admit date: 11/22/2015 Discharge date: 11/23/2015  Admission Diagnoses:  Principal Problem:   Closed fracture of lateral malleolus of left ankle Active Problems:   Fracture of left ankle, lateral malleolus   Discharge Diagnoses:  Same  Past Medical History  Diagnosis Date  . Heart murmur     as a child  . Hypertension     Surgeries: Procedure(s): OPEN REDUCTION INTERNAL FIXATION (ORIF) LEFT ANKLE LATERAL MALLEOLUS FRACTURE on 11/22/2015   Consultants:    Discharged Condition: Improved  Hospital Course: Scott Horne is an 40 y.o. male who was admitted 11/22/2015 for operative treatment ofClosed fracture of lateral malleolus of left ankle. Patient has severe unremitting pain that affects sleep, daily activities, and work/hobbies. After pre-op clearance the patient was taken to the operating room on 11/22/2015 and underwent  Procedure(s): OPEN REDUCTION INTERNAL FIXATION (ORIF) LEFT ANKLE LATERAL MALLEOLUS FRACTURE.    Patient was given perioperative antibiotics: Anti-infectives    Start     Dose/Rate Route Frequency Ordered Stop   11/22/15 1900  ceFAZolin (ANCEF) IVPB 1 g/50 mL premix     1 g 100 mL/hr over 30 Minutes Intravenous Every 6 hours 11/22/15 1738 11/23/15 0710   11/22/15 1400  ceFAZolin (ANCEF) IVPB 2 g/50 mL premix     2 g 100 mL/hr over 30 Minutes Intravenous To ShortStay [facility] 11/21/15 1314 11/22/15 1312       Patient was given sequential compression devices, early ambulation, and chemoprophylaxis to prevent DVT.  Patient benefited maximally from hospital stay and there were no complications.    Recent vital signs: Patient Vitals for the past 24 hrs:  BP Temp Temp src Pulse Resp SpO2 Height Weight  11/23/15 0514 (!) 145/97 mmHg 98.9 F (37.2 C) Oral 79 16 100 % - -  11/22/15 2138 (!) 145/89 mmHg 98.4 F (36.9 C) Oral 67 15 100 % - -  11/22/15 1700 (!) 139/92 mmHg 97.7 F (36.5 C) Oral 67  14 100 % - -  11/22/15 1633 (!) 133/94 mmHg 97.7 F (36.5 C) - 64 14 100 % - -  11/22/15 1626 - - - 69 15 100 % - -  11/22/15 1615 (!) 130/98 mmHg - - (!) 50 19 100 % - -  11/22/15 1545 (!) 124/97 mmHg - - - 19 - - -  11/22/15 1518 - - - - (!) 9 - - -  11/22/15 1503 116/87 mmHg - - - 15 - - -  11/22/15 1500 - 97.5 F (36.4 C) - - - - - -  11/22/15 1448 (!) 123/95 mmHg - - - 12 - - -  11/22/15 1433 126/89 mmHg - - - 11 - - -  11/22/15 1421 126/88 mmHg - - - 17 - - -  11/22/15 1420 - 97 F (36.1 C) - - - - - -  11/22/15 1103 (!) 121/94 mmHg 98.3 F (36.8 C) Oral 65 18 100 %  (1.803 m) 92.08 kg (203 lb)     Recent laboratory studies:  Recent Labs  11/22/15 1535  WBC 6.7  HGB 13.8  HCT 40.4  PLT 324  NA 139  K 4.6  CL 105  CO2 26  BUN 13  CREATININE 1.28*  GLUCOSE 93  CALCIUM 8.9     Discharge Medications:     Medication List    TAKE these medications        oxyCODONE-acetaminophen 5-325 MG tablet  Commonly  known as:  PERCOCET/ROXICET  Take 1-2 tablets by mouth every 4 (four) hours as needed for severe pain.        Diagnostic Studies: Dg Ankle 2 Views Left  11/22/2015  CLINICAL DATA:  ORIF of a left ankle fracture. EXAM: LEFT ANKLE - 2 VIEW; DG C-ARM 61-120 MIN COMPARISON:  11/12/2015 FINDINGS: Portable images show placement of a lateral fixation plate across the distal fibula maintaining the fibular fracture components in near-anatomic alignment. The ankle mortise is normally spaced and aligned. There is no evidence of an operative complication. Posterior malleolar fracture component is unchanged, nondisplaced. IMPRESSION: Operative imaging following ORIF of left distal fibular fracture. Fracture components are well aligned. No evidence of an operative complication. Electronically Signed   By: Amie Portland M.D.   On: 11/22/2015 14:37   Dg Ankle Complete Left  11/12/2015  CLINICAL DATA:  Pain and swelling of the left ankle status post fall. EXAM: LEFT ANKLE  COMPLETE - 3+ VIEW COMPARISON:  None. FINDINGS: There is a spiral comminuted mildly distracted fracture of the distal left fibular shaft with mild lateral displacement of the distal fracture fragment. The more inferior fracture line extends to the ankle mortise. There is an associated soft tissue swelling. Osseous mineralization is normal.  No other fractures identified. IMPRESSION: Intraarticular comminuted mildly distracted fracture of the distal fibula. Electronically Signed   By: Ted Mcalpine M.D.   On: 11/12/2015 22:21   Dg C-arm 1-60 Min  11/22/2015  CLINICAL DATA:  ORIF of a left ankle fracture. EXAM: LEFT ANKLE - 2 VIEW; DG C-ARM 61-120 MIN COMPARISON:  11/12/2015 FINDINGS: Portable images show placement of a lateral fixation plate across the distal fibula maintaining the fibular fracture components in near-anatomic alignment. The ankle mortise is normally spaced and aligned. There is no evidence of an operative complication. Posterior malleolar fracture component is unchanged, nondisplaced. IMPRESSION: Operative imaging following ORIF of left distal fibular fracture. Fracture components are well aligned. No evidence of an operative complication. Electronically Signed   By: Amie Portland M.D.   On: 11/22/2015 14:37    Disposition: 01-Home or Self Care      Discharge Instructions    Discharge patient    Complete by:  As directed            Follow-up Information    Follow up with Kathryne Hitch, MD In 2 weeks.   Specialty:  Orthopedic Surgery   Contact information:   [address] [place] [address] [phone]        Signed: Kathryne Hitch 11/23/2015, 7:25 AM

## 2015-12-12 MED ORDER — ROPIVACAINE HCL 5 MG/ML IJ SOLN
INTRAMUSCULAR | Status: DC | PRN
  Administered 2015-11-22: 25 mL via PERINEURAL

## 2015-12-12 NOTE — Addendum Note (Signed)
Addendum  created 12/12/15 1004 by Karlyne Greenspan, MD   Modules edited: Anesthesia Blocks and Procedures, Anesthesia Medication Administration, Clinical Notes   Clinical Notes:  File: [id number]

## 2016-03-27 IMAGING — RF DG ANKLE 2V *L*
1 series · 2 of 2 positions shown · non-contrast
Comparison: 11/12/2015

CLINICAL DATA: ORIF of a left ankle fracture.

EXAM:
LEFT ANKLE - 2 VIEW; DG C-ARM 61-120 MIN

[Series 1: run · 2 of 2 slices shown]
[im 1/2]
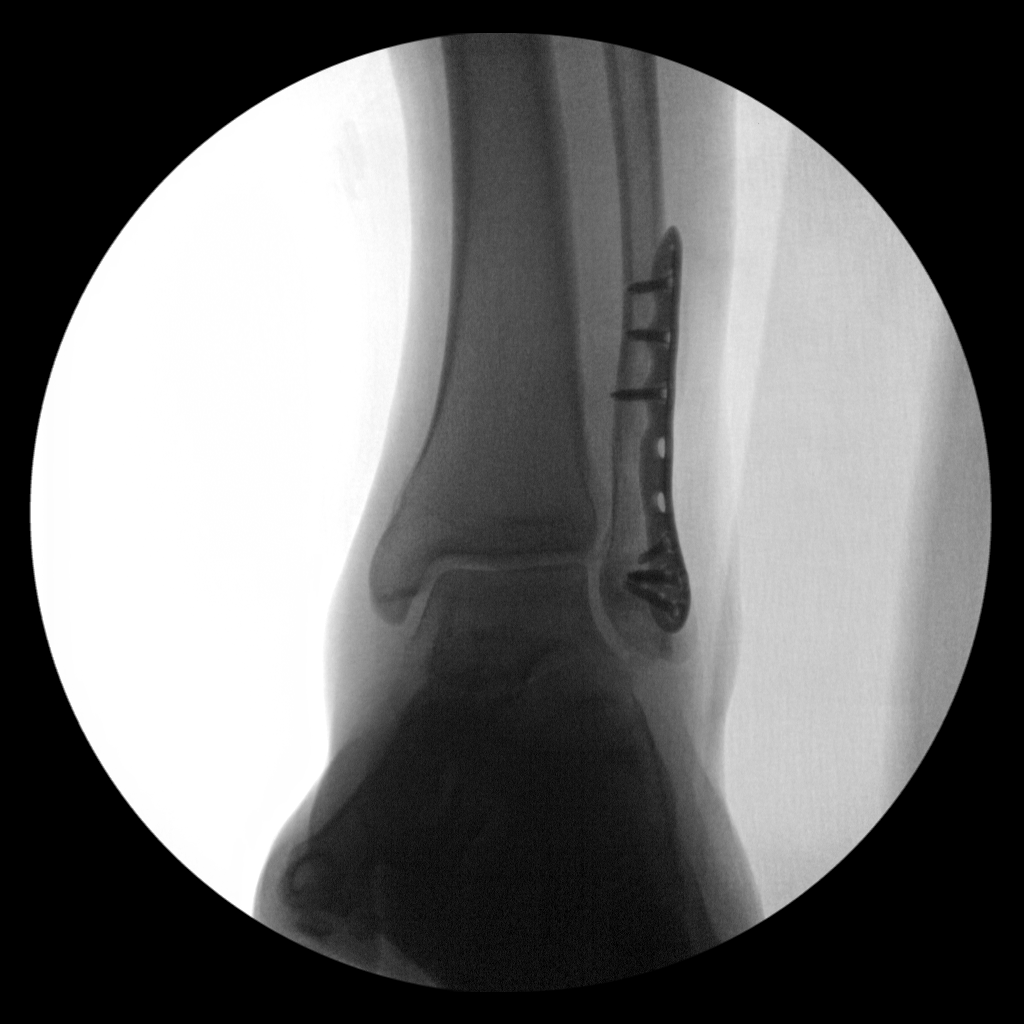
[im 2/2]
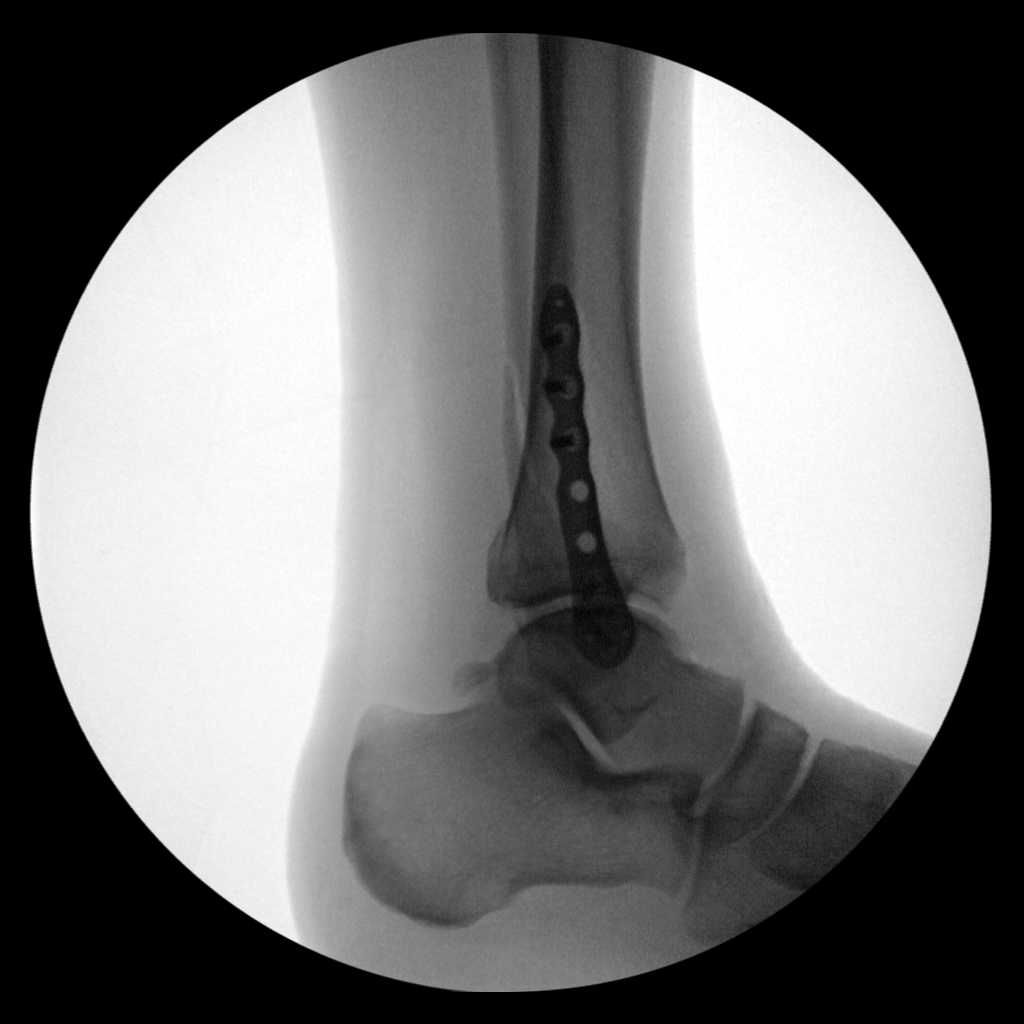

[2 of 2 positions shown; findings below may reference images not displayed]

FINDINGS: Portable images show placement of a lateral fixation plate across
the distal fibula maintaining the fibular fracture components in
near-anatomic alignment. The ankle mortise is normally spaced and
aligned. There is no evidence of an operative complication.
Posterior malleolar fracture component is unchanged, nondisplaced.
IMPRESSION: Operative imaging following ORIF of left distal fibular fracture.
Fracture components are well aligned. No evidence of an operative
complication.
